# Patient Record
Sex: Female | Born: 1955
Health system: Southern US, Community
[De-identification: ages and names within clinical notes are randomized; demographics above are authoritative.]

---

## 2005-05-20 ENCOUNTER — Ambulatory Visit: Payer: Self-pay | Admitting: *Deleted

## 2005-05-20 ENCOUNTER — Ambulatory Visit: Payer: Self-pay | Admitting: Nurse Practitioner

## 2017-03-05 ENCOUNTER — Emergency Department (HOSPITAL_COMMUNITY): Payer: Self-pay

## 2017-03-05 ENCOUNTER — Emergency Department (HOSPITAL_COMMUNITY)
Admission: EM | Admit: 2017-03-05 | Discharge: 2017-03-05 | Disposition: A | Discharge status: Home / Self Care | Payer: Self-pay | Attending: Emergency Medicine | Admitting: Emergency Medicine

## 2017-03-05 ENCOUNTER — Other Ambulatory Visit: Payer: Self-pay

## 2017-03-05 ENCOUNTER — Encounter (HOSPITAL_COMMUNITY): Payer: Self-pay | Admitting: Emergency Medicine

## 2017-03-05 DIAGNOSIS — R609 Edema, unspecified: Secondary | ICD-10-CM | POA: Insufficient documentation

## 2017-03-05 DIAGNOSIS — D259 Leiomyoma of uterus, unspecified: Secondary | ICD-10-CM | POA: Insufficient documentation

## 2017-03-05 DIAGNOSIS — D219 Benign neoplasm of connective and other soft tissue, unspecified: Secondary | ICD-10-CM

## 2017-03-05 DIAGNOSIS — R1030 Lower abdominal pain, unspecified: Secondary | ICD-10-CM

## 2017-03-05 LAB — URINALYSIS, ROUTINE W REFLEX MICROSCOPIC
Bilirubin Urine: NEGATIVE
GLUCOSE, UA: NEGATIVE mg/dL
HGB URINE DIPSTICK: NEGATIVE
Ketones, ur: NEGATIVE mg/dL
NITRITE: NEGATIVE
Protein, ur: NEGATIVE mg/dL
SPECIFIC GRAVITY, URINE: 1.016 (ref 1.005–1.030)
pH: 7 (ref 5.0–8.0)

## 2017-03-05 LAB — RAPID URINE DRUG SCREEN, HOSP PERFORMED
AMPHETAMINES: NOT DETECTED
Barbiturates: NOT DETECTED
Benzodiazepines: NOT DETECTED
Cocaine: NOT DETECTED
Opiates: NOT DETECTED
TETRAHYDROCANNABINOL: NOT DETECTED

## 2017-03-05 LAB — CBC WITH DIFFERENTIAL/PLATELET
BASOS ABS: 0 10*3/uL (ref 0.0–0.1)
BASOS PCT: 1 %
Eosinophils Absolute: 0.2 10*3/uL (ref 0.0–0.7)
Eosinophils Relative: 4 %
HEMATOCRIT: 38.1 % (ref 36.0–46.0)
HEMOGLOBIN: 12.5 g/dL (ref 12.0–15.0)
LYMPHS ABS: 2.4 10*3/uL (ref 0.7–4.0)
LYMPHS PCT: 42 %
MCH: 31.3 pg (ref 26.0–34.0)
MCHC: 32.8 g/dL (ref 30.0–36.0)
MCV: 95.5 fL (ref 78.0–100.0)
MONOS PCT: 10 %
Monocytes Absolute: 0.6 10*3/uL (ref 0.1–1.0)
Neutro Abs: 2.5 10*3/uL (ref 1.7–7.7)
Neutrophils Relative %: 43 %
Platelets: 308 10*3/uL (ref 150–400)
RBC: 3.99 MIL/uL (ref 3.87–5.11)
RDW: 13.8 % (ref 11.5–15.5)
WBC: 5.7 10*3/uL (ref 4.0–10.5)

## 2017-03-05 LAB — COMPREHENSIVE METABOLIC PANEL
ALK PHOS: 66 U/L (ref 38–126)
ALT: 24 U/L (ref 14–54)
ANION GAP: 6 (ref 5–15)
AST: 33 U/L (ref 15–41)
Albumin: 3.8 g/dL (ref 3.5–5.0)
BILIRUBIN TOTAL: 0.4 mg/dL (ref 0.3–1.2)
BUN: 12 mg/dL (ref 6–20)
CALCIUM: 9 mg/dL (ref 8.9–10.3)
CO2: 25 mmol/L (ref 22–32)
CREATININE: 0.97 mg/dL (ref 0.44–1.00)
Chloride: 109 mmol/L (ref 101–111)
GFR calc non Af Amer: >60 mL/min (ref >60)
GLUCOSE: 107 mg/dL — AB (ref 65–99)
Potassium: 4.1 mmol/L (ref 3.5–5.1)
Sodium: 140 mmol/L (ref 135–145)
TOTAL PROTEIN: 6.8 g/dL (ref 6.5–8.1)

## 2017-03-05 LAB — I-STAT TROPONIN, ED: TROPONIN I, POC: 0.01 ng/mL (ref 0.00–0.08)

## 2017-03-05 LAB — ACETAMINOPHEN LEVEL: Acetaminophen (Tylenol), Serum: <10 ug/mL — ABNORMAL LOW (ref 10–30)

## 2017-03-05 LAB — ETHANOL: Alcohol, Ethyl (B): <10 mg/dL (ref <10)

## 2017-03-05 LAB — BRAIN NATRIURETIC PEPTIDE: B NATRIURETIC PEPTIDE 5: 22.5 pg/mL (ref 0.0–100.0)

## 2017-03-05 LAB — TSH: TSH: 0.464 u[IU]/mL (ref 0.350–4.500)

## 2017-03-05 MED ORDER — IOPAMIDOL (ISOVUE-300) INJECTION 61%
100.0000 mL | Freq: Once | INTRAVENOUS | Status: AC | PRN
  Administered 2017-03-05: 100 mL via INTRAVENOUS

## 2017-03-05 MED ORDER — FUROSEMIDE 20 MG PO TABS: ORAL_TABLET | ORAL | 0 refills | Status: AC

## 2017-03-05 MED ORDER — IOPAMIDOL (ISOVUE-300) INJECTION 61%
INTRAVENOUS | Status: AC
  Filled 2017-03-05: qty 100

## 2017-03-05 NOTE — ED Provider Notes (Signed)
[facility] DEPT Provider Note   CSN: [id number] Arrival date & time: 03/05/17  1420     History   Chief Complaint Chief Complaint  Patient presents with  . Leg Swelling    HPI Rachel Medina is a 61 y.o. female.  HPI Patient has multiple complaints.  She reports she has started developing swelling in both of her feet over the past week.  It has been gradual in onset.  She reports she tries elevating and applying creams but it is not helping.  She reports she is also been having discomfort in her lower back and flanks and pelvis.  She reports that the aching cramping pain reminiscent of menstrual pains although she is no longer menstruating.  She denies urinary symptoms or vaginal discharge.  She also endorses waxing and waning chest discomfort diffuse pressure quality and some shortness of breath.  She has a hard time quantifying how long all the symptoms have been developing.  She seems to indicate she had problems with swelling of her legs once may be a few months ago but it resolved with some of her home remedies.  The patient is living at the [facility].  She seems alert and appropriate but notably, at one point during the interview she told me that she was under "cyber attack" and that was a lot of why she had the problems with swelling and pain in her feet.  She reports is part of a virtual reality world and she does have to spend a lot of time carrying heavy things around and walking.  Patient denies drug or alcohol use.  She denies medication except "natural herbal remedies". History reviewed. No pertinent past medical history.  There are no active problems to display for this patient.   History reviewed. No pertinent surgical history.  OB History    No data available       Home Medications    Prior to Admission medications   Medication Sig Start Date End Date Taking? Authorizing Provider  ibuprofen (ADVIL,MOTRIN) 200 MG tablet Take  200 mg by mouth daily as needed (PAIN).   Yes [provider]  Multiple Vitamins-Calcium (ONE-A-DAY WOMENS PO) Take 1 tablet by mouth daily.   Yes [provider]  furosemide (LASIX) 20 MG tablet Take 1 tablet daily if needed for ankle and foot swelling. 03/05/17   Charlesetta Shanks, MD    Family History No family history on file.  Social History Social History   Tobacco Use  . Smoking status: Not on file  Substance Use Topics  . Alcohol use: Not on file  . Drug use: Not on file     Allergies   Patient has no known allergies.   Review of Systems Review of Systems 10 Systems reviewed and are negative for acute change except as noted in the HPI.   Physical Exam Updated Vital Signs BP 134/71 (BP Location: Left Arm)   Pulse 80   Temp 98.1 F (36.7 C) (Oral)   Resp 18   SpO2 98%   Physical Exam  Constitutional:  Patient is alert and nontoxic.  She is clinically well in appearance.  She is attentive and appropriately interactive.  She does not appear to be responding to external stimuli.  HENT:  Head: Normocephalic and atraumatic.  Nose: Nose normal.  Mouth/Throat: Oropharynx is clear and moist.  Eyes: EOM are normal. Pupils are equal, round, and reactive to light.  Neck: Neck supple.  Cardiovascular: Normal rate, regular  rhythm, normal heart sounds and intact distal pulses.  Pulmonary/Chest: Effort normal and breath sounds normal.  Abdominal: Soft. She exhibits no distension. There is no tenderness. There is no guarding.  Musculoskeletal: Normal range of motion. She exhibits edema. She exhibits no tenderness or deformity.  Bilateral feet and legs have 1-2+ pitting edema to below the level of the knees.  Skin condition is good.  Patient does not have any wounds or appearance of hyperpigmentation to suggest chronic venous stasis.  Skin is not thinned  Neurological: She is alert. No cranial nerve deficit. She exhibits normal muscle tone. Coordination normal.   Skin: Skin is warm and dry.  Psychiatric:  Patient is pleasantly interactive and cooperative.  She is focused and, aside from telling me about being part of a virtual reality, it was not evident by her behavior or interaction.     ED Treatments / Results  Labs (all labs ordered are listed, but only abnormal results are displayed) Labs Reviewed  COMPREHENSIVE METABOLIC PANEL - Abnormal; Notable for the following components:      Result Value   Glucose, Bld 107 (*)    All other components within normal limits  ACETAMINOPHEN LEVEL - Abnormal; Notable for the following components:   Acetaminophen (Tylenol), Serum <10 (*)    All other components within normal limits  URINALYSIS, ROUTINE W REFLEX MICROSCOPIC - Abnormal; Notable for the following components:   Leukocytes, UA TRACE (*)    Bacteria, UA RARE (*)    Squamous Epithelial / LPF 0-5 (*)    All other components within normal limits  ETHANOL  CBC WITH DIFFERENTIAL/PLATELET  RAPID URINE DRUG SCREEN, HOSP PERFORMED  TSH  BRAIN NATRIURETIC PEPTIDE  I-STAT TROPONIN, ED    EKG  EKG Interpretation None       Radiology Dg Chest 2 View  Result Date: 03/05/2017 CLINICAL DATA:  Chest pain with shortness of breath and cough EXAM: CHEST  2 VIEW COMPARISON:  None. FINDINGS: The heart size and mediastinal contours are within normal limits. Both lungs are clear. The visualized skeletal structures are unremarkable. IMPRESSION: No active cardiopulmonary disease. Electronically Signed   By: Donavan Foil M.D.   On: 03/05/2017 19:13   Ct Abdomen Pelvis W Contrast  Result Date: 03/05/2017 CLINICAL DATA:  Diffuse abdominal pain and bilateral foot swelling for the past 4 days. EXAM: CT ABDOMEN AND PELVIS WITH CONTRAST TECHNIQUE: Multidetector CT imaging of the abdomen and pelvis was performed using the standard protocol following bolus administration of intravenous contrast. CONTRAST:  178mL ISOVUE-300 IOPAMIDOL (ISOVUE-300) INJECTION 61%  COMPARISON:  None. FINDINGS: Lower chest: Minimal bilateral dependent atelectasis. The remainder of the lungs are mildly hyperexpanded with mild bilateral bullous changes and mild diffuse peribronchial thickening. Hepatobiliary: Tiny right lobe liver cyst. Normal appearing gallbladder. Pancreas: Unremarkable. No pancreatic ductal dilatation or surrounding inflammatory changes. Spleen: Normal in size without focal abnormality. Adrenals/Urinary Tract: Small right renal cysts. Normal appearing left kidney, ureters and urinary bladder. Normal appearing adrenal glands. Stomach/Bowel: Multiple colonic diverticula without evidence of diverticulitis. No evidence of appendicitis. Unremarkable stomach and small bowel. Vascular/Lymphatic: Mild atheromatous arterial calcification without aneurysm. No enlarged lymph nodes. Reproductive: Mildly enlarged, heterogeneous uterus, containing multiple small masses. The largest is in the anterior fundus, measuring 2.2 cm in maximum diameter on sagittal image number 63. Other: Small umbilical hernia containing fat. Musculoskeletal: Minimal lumbar and mild lower thoracic spine degenerative changes. IMPRESSION: 1. No acute abnormality. 2. Mild changes of COPD and chronic bronchitis. 3. Colonic diverticulosis. 4.  Uterine fibroids. Electronically Signed   By: Claudie Revering M.D.   On: 03/05/2017 20:42    Procedures Procedures (including critical care time)  Medications Ordered in ED Medications  iopamidol (ISOVUE-300) 61 % injection (not administered)  iopamidol (ISOVUE-300) 61 % injection 100 mL (100 mLs Intravenous Contrast Given 03/05/17 2023)     Initial Impression / Assessment and Plan / ED Course  I have reviewed the triage vital signs and the nursing notes.  Pertinent labs & imaging results that were available during my care of the patient were reviewed by me and considered in my medical decision making (see chart for details).     Final Clinical Impressions(s) / ED  Diagnoses   Final diagnoses:  Peripheral edema  Lower abdominal pain  Fibroids  Patient presented with multiple complaints and no significant past medical care.  Chief complaint is peripheral edema.  Patient does not have have any skin wounds or signs of arterial dysfunction.  At this time, findings are most consistent with venous stasis peripheral edema.  Patient is given as needed Lasix and low-dose to take.  Counseled on elevating and compression.  Patient also in review of systems described lower abdominal pain that is cramping and pressure-like in nature.  CT scan showed uterine fibroids but no other acute findings.  No intrapelvic compressive lesions to explain patient's peripheral edema.  At this time I feel she is safe to follow-up with gynecology.  Abdominal examination is soft without guarding or significant tenderness.  Clinically patient is well and alert.  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  Despite this, the patient did not show any other signs of decompensation.  She was attentive and focused to our conversations and had appropriate responses.  He showed comprehension for our discussion of her medical findings.  At this time, I do not see indication for psychiatric consultation or admission.  Feel patient is safe to follow-up with the PCP and this can be addressed as part of a general evaluation.  ED Discharge Orders        Ordered    furosemide (LASIX) 20 MG tablet     03/05/17 2107       Charlesetta Shanks, MD 03/05/17 2116

## 2017-03-05 NOTE — ED Notes (Signed)
Pt is resting and appears comfortable.  Denies any cp/dizziness/SOB at this time. Pt is A&Ox 4.

## 2017-03-05 NOTE — Discharge Instructions (Signed)
1.  You should have routine medical care.  Make an appointment with a family doctor using the referral number in your discharge instructions or go to [facility] to start new patient application. 2.  You have uterine fibroids but you describe lower abdominal pain with these, make an appointment with a gynecologist.  You can use the number provided above for women's outpatient clinic.  You also should be getting regular Pap smears to test for possible cervical cancer.  This is very important as this is a treatable condition if caught early. 3.  You may take a Lasix pill if needed for swelling of your ankle and lower legs.  Do not need to take this medication daily.  You should elevate your legs as much as possible.  You should also try to find compression hose for venous stasis.

## 2017-03-05 NOTE — ED Notes (Signed)
ED Provider at bedside. 

## 2017-03-05 NOTE — ED Notes (Signed)
Pt ambulatory and independent at discharge.  Verbalized understanding of discharge instructions 

## 2017-03-05 NOTE — ED Triage Notes (Signed)
Per EMS pt complaint of worsening bilateral leg swelling over past four days.Denies hx of same.

## 2017-03-05 NOTE — ED Notes (Signed)
Patient transported to X-ray 

## 2017-03-10 MED FILL — FUROSEMIDE 20 MG TABLET: 20 | 15 days supply | Qty: 15 | Fill #0

## 2017-03-16 NOTE — Congregational Nurse Program (Signed)
Congregational Nurse Program Note  Date of Encounter: 03/10/2017  Past Medical History: No past medical history on file.  Encounter Details: CNP Questionnaire - 03/10/17 1516      Questionnaire   Patient Status  Not Applicable    Race  Black or African American    Location Patient Served At  Not Applicable    Insurance  Not Applicable    Uninsured  Uninsured (Subsequent visits/quarter)    Food  Yes, have food insecurities;Within past 12 months, worried food would run out with no money to buy more    Housing/Utilities  No permanent housing    Transportation  Yes, need transportation assistance;Provided transportation assistance (bus pass, taxi voucher, etc.)    Interpersonal Safety  No, do not feel physically and emotionally safe where you currently live    Medication  Yes, have medication insecurities    Medical Provider  No    Referrals  Primary Care Provider/Clinic    ED Visit Averted  Not Applicable    Life-Saving Intervention Made  Not Applicable     Client came to the [facility] to pick up medications.  Instructions provided. She states she has an appointment with [facility].  Encouraged her to keep that appointment

## 2017-03-16 NOTE — Congregational Nurse Program (Signed)
Congregational Nurse Program Note  Date of Encounter: 03/08/2017  Past Medical History: No past medical history on file.  Encounter Details: CNP Questionnaire - 03/08/17 1512      Questionnaire   Patient Status  Not Applicable    Race  Black or African American    Location Patient Served At  Not Applicable    Insurance  Not Applicable    Uninsured  Uninsured (NEW 1x/quarter)    Food  Yes, have food insecurities;Within past 12 months, worried food would run out with no money to buy more    Housing/Utilities  No permanent housing    Transportation  Yes, need transportation assistance;Provided transportation assistance (bus pass, taxi voucher, etc.)    Interpersonal Safety  No, do not feel physically and emotionally safe where you currently live    Medication  Yes, have medication insecurities;Provided medication assistance    Medical Provider  No    Referrals  Primary Care Provider/Clinic;Medication Assistance    ED Visit Averted  Not Applicable    Life-Saving Intervention Made  Not Applicable      States was recently seen in the ED for edema of her legs and ankles.  Presented a script for Lasix.  Gave client bus passes to come to the [facility] to pick up medication once it is filled at the [facility].

## 2017-03-19 ENCOUNTER — Encounter: Payer: Self-pay | Admitting: Pediatric Intensive Care

## 2017-04-05 ENCOUNTER — Encounter: Payer: Self-pay | Admitting: Pediatric Intensive Care

## 2017-04-08 NOTE — Congregational Nurse Program (Signed)
Congregational Nurse Program Note  Date of Encounter: 03/19/2017  Past Medical History: No past medical history on file.  Encounter Details: CNP Questionnaire - 03/19/17 1100      Questionnaire   Patient Status  Not Applicable    Race  Black or African American    Location Patient Served At  [facility]  Not Applicable    Uninsured  Uninsured (NEW 1x/quarter)    Food  Within past 12 months, food ran out with no money to buy more;Yes, have food insecurities    Housing/Utilities  No permanent housing    Transportation  Yes, need transportation assistance    Interpersonal Safety  No, do not feel physically and emotionally safe where you currently live    Medication  Yes, have medication insecurities    Medical Provider  No    Referrals  Primary Care Provider/Clinic    ED Visit Averted  Not Applicable    Life-Saving Intervention Made  Not Applicable      Client had ED visit last month and was given prescription for lasix. She has pitting edema of lower legs and feet. She states she smokes and has occaisional shortness of breath. BBS- fair air movement with inspiratory wheeze. Client states that she has follow up at [facility] next week. CN encouraged client to make sure she goes to appointment and offered bus passes when closer to appointment time. Client asked CN for bed rest. CN explained that bedrest is collaborative and that she must take an active role in health. CN also explained that bedrest will also be short term. CN will speak to staff regarding 1 hour per day x2 when client has a bed space.

## 2017-04-30 NOTE — Congregational Nurse Program (Signed)
Congregational Nurse Program Note  Date of Encounter: 04/05/2017  Past Medical History: No past medical history on file.  Encounter Details: CNP Questionnaire - 04/05/17 1100      Questionnaire   Patient Status  Not Applicable    Race  Black or African American    Location Patient Served At  [facility]  Not Applicable    Uninsured  Uninsured (Subsequent visits/quarter)    Food  Within past 12 months, worried food would run out with no money to buy more    Housing/Utilities  No permanent housing    Transportation  Yes, need transportation assistance;Provided transportation assistance (bus pass, taxi voucher, etc.)    Interpersonal Safety  No, do not feel physically and emotionally safe where you currently live    Medication  Yes, have medication insecurities    Medical Provider  Yes    Referrals  Primary Care Provider/Clinic    ED Visit Averted  Not Applicable    Life-Saving Intervention Made  Not Applicable     Client states that she continues to have lower extremity swelling. Client has 2 + edema. Client states that she has a follow up appointment at [facility] clinic and needs bus passes. CN will give bus passes tomorrow for WEdnesday appointment.

## 2019-05-08 IMAGING — CT CT ABD-PELV W/ CM
2 of 5 series · 16 of 46 positions shown, 18 images · IV contrast (ISOVUE)
Comparison: None.

CLINICAL DATA: Diffuse abdominal pain and bilateral foot swelling
for the past 4 days.

EXAM:
CT ABDOMEN AND PELVIS WITH CONTRAST
TECHNIQUE: Multidetector CT imaging of the abdomen and pelvis was performed
using the standard protocol following bolus administration of
intravenous contrast.
CONTRAST:  100mL TOVZK8-VBB IOPAMIDOL (TOVZK8-VBB) INJECTION 61%

[Series 2: axial st · axial · 0.79mm/px · z∈[-416,-36]mm · 13 of 88 slices shown, 15 images]
[im 6/88  soft-tissue]
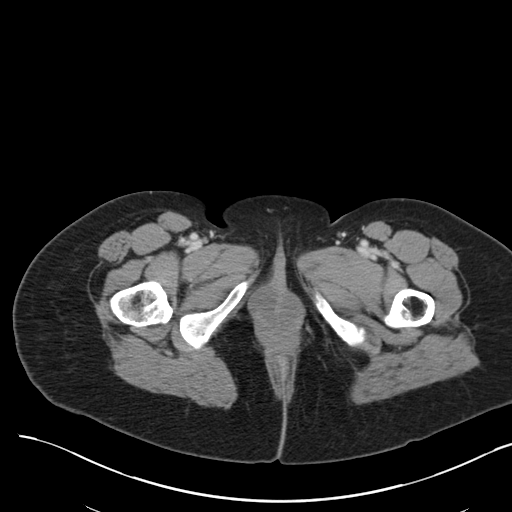
[im 6/88  bone]
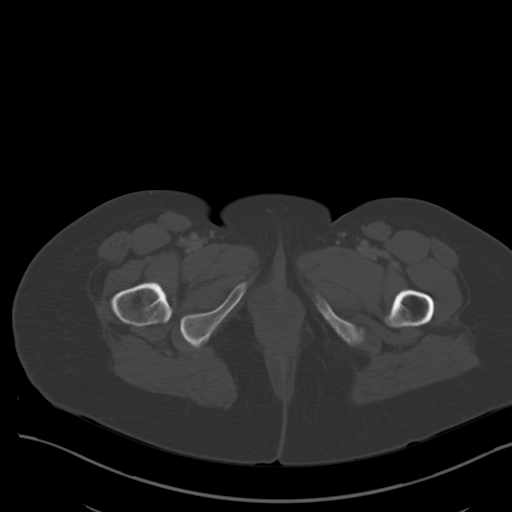
[im 12/88  soft-tissue]
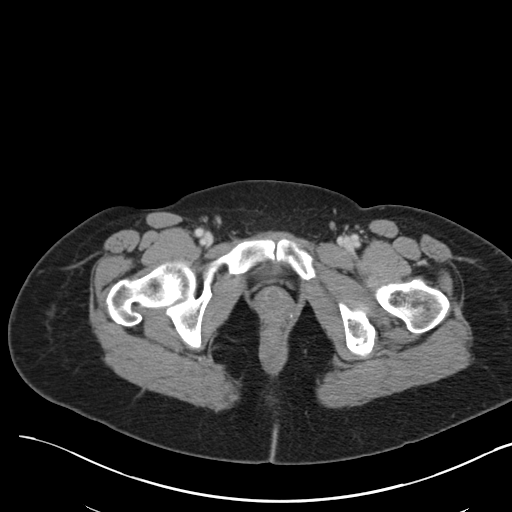
[im 18/88  soft-tissue]
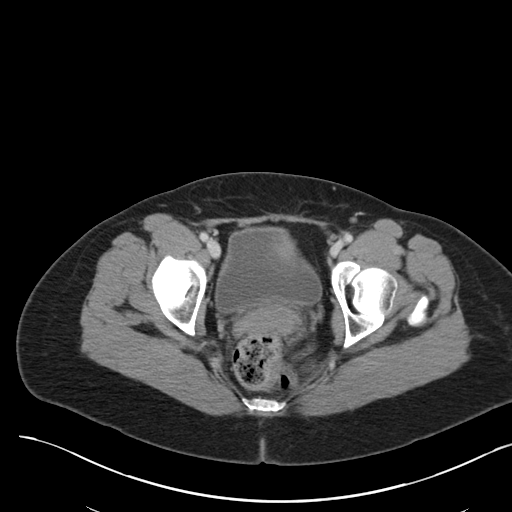
[im 24/88  soft-tissue]
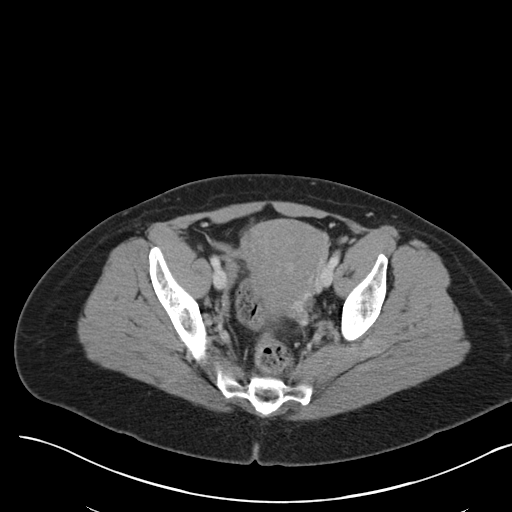
[im 30/88  soft-tissue]
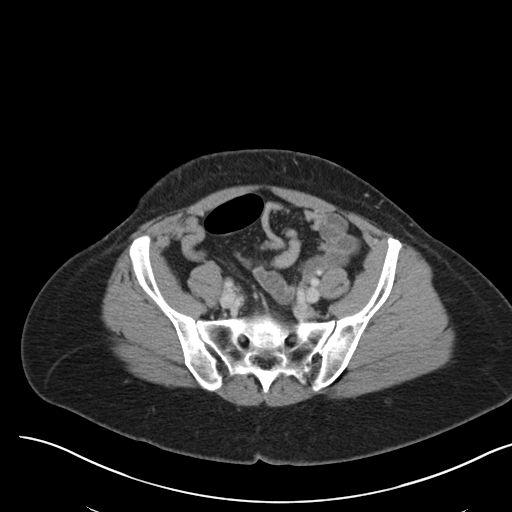
[im 35/88  soft-tissue]
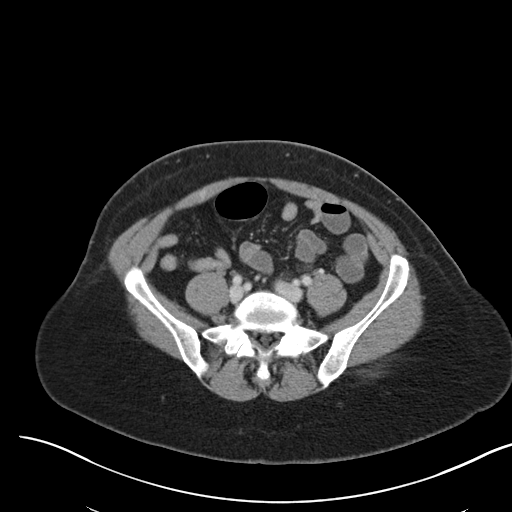
[im 47/88  soft-tissue]
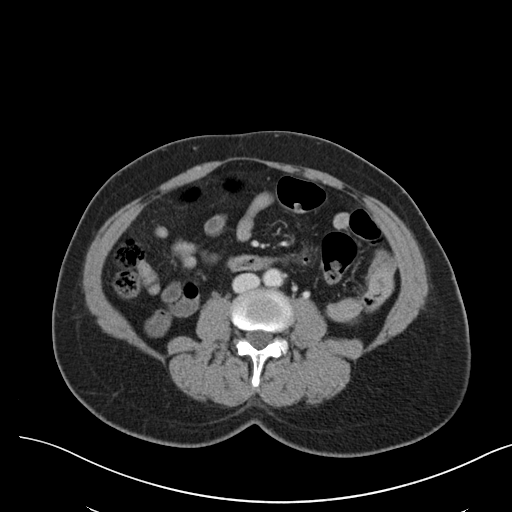
[im 53/88  soft-tissue]
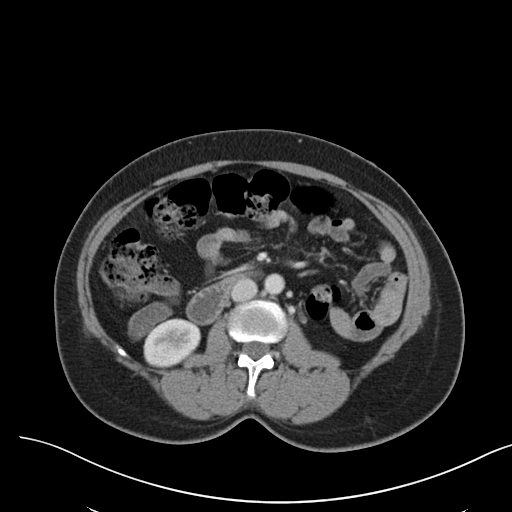
[im 59/88  soft-tissue]
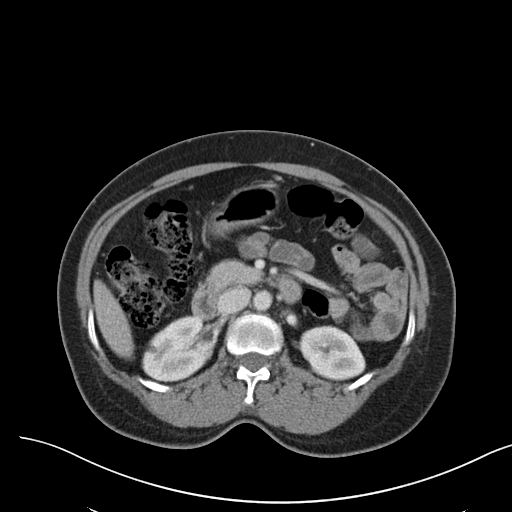
[im 59/88  bone]
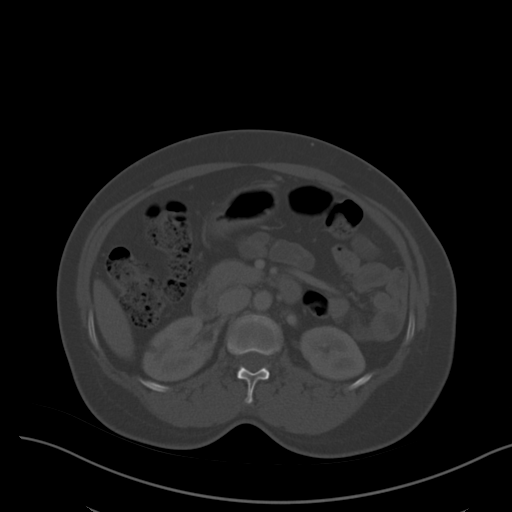
[im 64/88  soft-tissue]
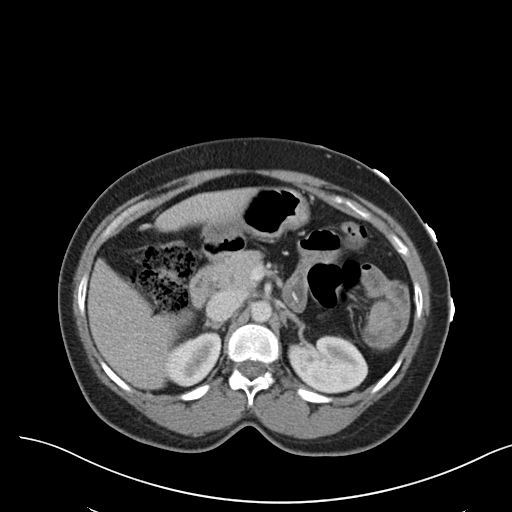
[im 70/88  soft-tissue]
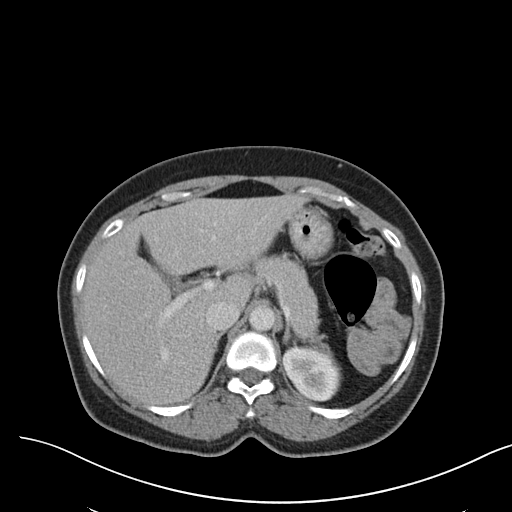
[im 76/88  soft-tissue]
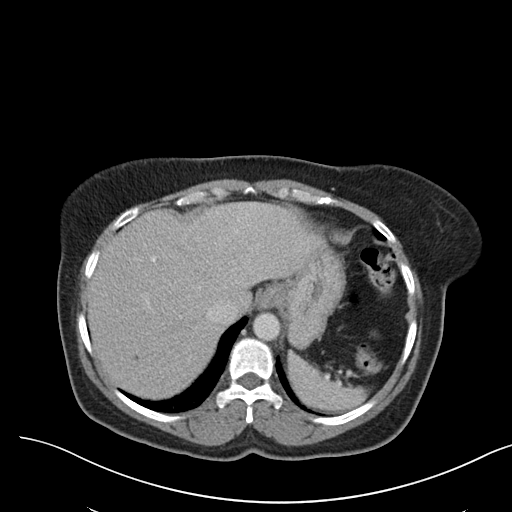
[im 82/88  soft-tissue]
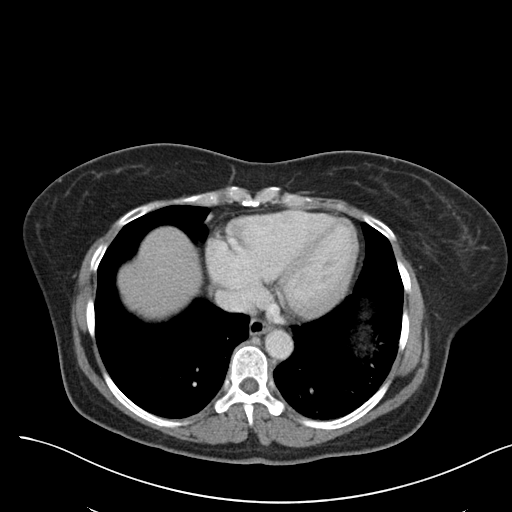

[Series 5: coronal st · coronal · 0.74mm/px · 3 of 92 slices shown]
[im 31/92  soft-tissue]
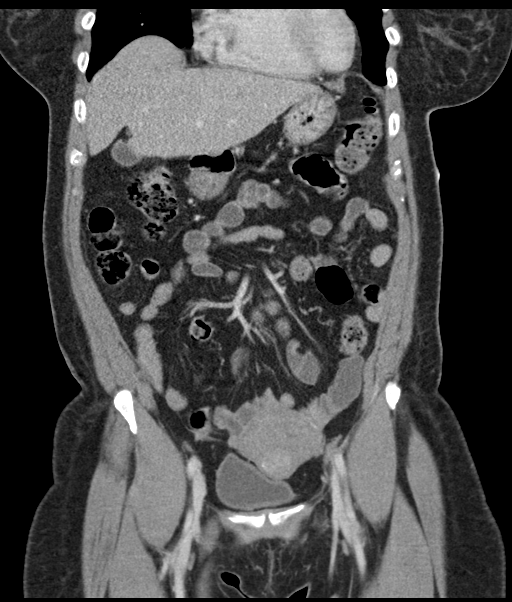
[im 41/92  soft-tissue]
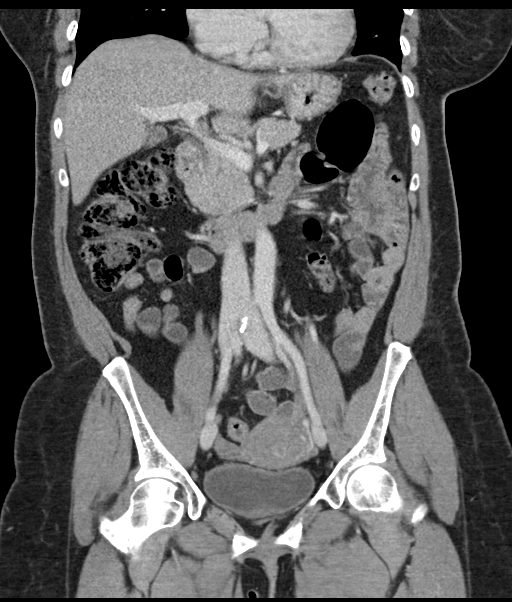
[im 51/92  soft-tissue]
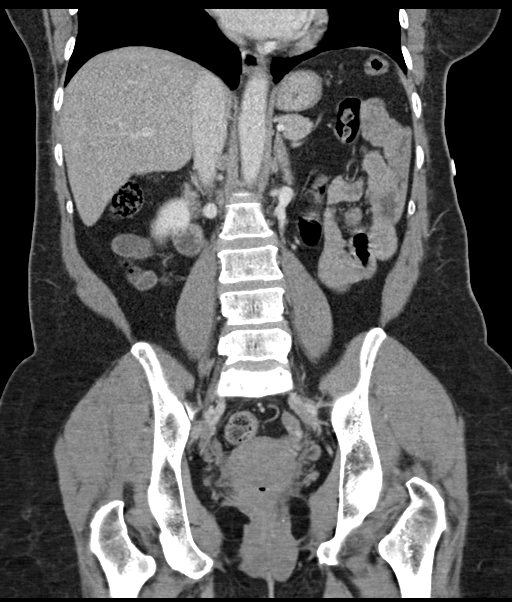

[16 of 46 positions shown; findings below may reference images not displayed]

FINDINGS: Lower chest: Minimal bilateral dependent atelectasis. The remainder
of the lungs are mildly hyperexpanded with mild bilateral bullous
changes and mild diffuse peribronchial thickening.

Hepatobiliary: Tiny right lobe liver cyst. Normal appearing
gallbladder.

Pancreas: Unremarkable. No pancreatic ductal dilatation or
surrounding inflammatory changes.

Spleen: Normal in size without focal abnormality.

Adrenals/Urinary Tract: Small right renal cysts. Normal appearing
left kidney, ureters and urinary bladder. Normal appearing adrenal
glands.

Stomach/Bowel: Multiple colonic diverticula without evidence of
diverticulitis. No evidence of appendicitis. Unremarkable stomach
and small bowel.

Vascular/Lymphatic: Mild atheromatous arterial calcification without
aneurysm. No enlarged lymph nodes.

Reproductive: Mildly enlarged, heterogeneous uterus, containing
multiple small masses. The largest is in the anterior fundus,
measuring 2.2 cm in maximum diameter on sagittal image number 63.

Other: Small umbilical hernia containing fat.

Musculoskeletal: Minimal lumbar and mild lower thoracic spine
degenerative changes.
IMPRESSION: 1. No acute abnormality.
2. Mild changes of COPD and chronic bronchitis.
3. Colonic diverticulosis.
4. Uterine fibroids.
